# Patient Record
Sex: Male | Born: 1995 | Hispanic: Yes | Marital: Single | Smoking: Current some day smoker
Health system: Southern US, Community
[De-identification: ages and names within clinical notes are randomized; demographics above are authoritative.]

---

## 2020-08-06 ENCOUNTER — Encounter: Payer: Self-pay | Admitting: Emergency Medicine

## 2020-08-06 ENCOUNTER — Other Ambulatory Visit: Payer: Self-pay

## 2020-08-06 ENCOUNTER — Emergency Department
Admission: EM | Admit: 2020-08-06 | Discharge: 2020-08-06 | Disposition: A | Discharge status: Home / Self Care | Payer: Worker's Compensation | Attending: Emergency Medicine | Admitting: Emergency Medicine

## 2020-08-06 ENCOUNTER — Emergency Department: Payer: Worker's Compensation

## 2020-08-06 DIAGNOSIS — S0990XA Unspecified injury of head, initial encounter: Secondary | ICD-10-CM | POA: Diagnosis present

## 2020-08-06 DIAGNOSIS — S0003XA Contusion of scalp, initial encounter: Secondary | ICD-10-CM | POA: Insufficient documentation

## 2020-08-06 DIAGNOSIS — W228XXA Striking against or struck by other objects, initial encounter: Secondary | ICD-10-CM | POA: Diagnosis not present

## 2020-08-06 DIAGNOSIS — R040 Epistaxis: Secondary | ICD-10-CM | POA: Insufficient documentation

## 2020-08-06 DIAGNOSIS — F17200 Nicotine dependence, unspecified, uncomplicated: Secondary | ICD-10-CM | POA: Insufficient documentation

## 2020-08-06 DIAGNOSIS — Y990 Civilian activity done for income or pay: Secondary | ICD-10-CM | POA: Diagnosis not present

## 2020-08-06 NOTE — ED Triage Notes (Signed)
Pt to ED from work c/o head injury.  Filing WC from [employer].  States was bending over and when he came up hit his head on some equipment, had bleeding at time of accident from top of head and nose but bleeding controlled at this time.  Denies LOC.  Pt A&Ox4, ambulatory steady gait, in NAD at this time.

## 2020-08-06 NOTE — ED Notes (Signed)
Pt came in to be seen from [employer] with Medical treatment authorization form.  The form states we are to do a Workers Compensation initial visit and no lab testing is marked on the form that needs to be done.  I asked the patient if he had a supervisors number to call to verify, he stated no.  I called [employer] at [phone] and Jodi Marble at [phone] ext [phone] and left a message regarding any lab testing needed.  The message also included that the patients identity needed to be verified because the patient did not have any ID, photo or otherwise.  Awaiting a return call from either supervisor.  Forms placed in the box to go to medical records to be scanned into chart.

## 2020-08-06 NOTE — ED Notes (Signed)
Spoke with supervisor Lafonda Mosses)  States she would send someone here to verify this pt   Pt currently does not have ID on him.  States his ID is in his truck at work  Was brought to ED by security from [employer]

## 2020-08-06 NOTE — Discharge Instructions (Signed)
Follow-up with your company's doctor if any continued problems.  The CT scan did not show a brain injury.  You may take Tylenol as needed for pain.  You may also apply ice packs to your scalp as needed for discomfort or if any swelling.  Do not blow your nose as it may cause more nose bleeding

## 2020-08-06 NOTE — ED Provider Notes (Addendum)
[facility] Emergency Department Provider Note   ____________________________________________   None    (approximate)  I have reviewed the triage vital signs and the nursing notes.   HISTORY  Chief Complaint Head Injury Spanish interpreter  HPI Nicholas Mccormick is a 25 y.o. male presents to the ED for a reported Workmen's Comp. injury.  Patient states that he hit his head on a piece of equipment at approximately 11:30 PM without loss of consciousness.  Patient states that at approximately 1 AM he had a nosebleed and then again at 5:45 AM.  Patient had some mild visual disturbance that lasted "seconds".  He denies any nausea, vomiting, dizziness or difficulty with vision at this time.  He denies any facial injury or direct contact to his nose.  Currently rates pain as 5/10.     History reviewed. No pertinent past medical history.  There are no problems to display for this patient.   History reviewed. No pertinent surgical history.  Prior to Admission medications   Not on File    Allergies Patient has no known allergies.  History reviewed. No pertinent family history.  Social History Social History   Tobacco Use  . Smoking status: Current Some Day Smoker  . Smokeless tobacco: Never Used  Substance Use Topics  . Drug use: Never    Review of Systems Constitutional: No fever/chills Eyes: No visual changes. ENT: No sore throat.  Positive for nosebleed. Cardiovascular: Denies chest pain. Respiratory: Denies shortness of breath. Gastrointestinal: No abdominal pain.  No nausea, no vomiting.  Musculoskeletal: Negative for back pain. Skin: Negative for rash. Neurological: Negative for headaches, focal weakness or numbness. ____________________________________________   PHYSICAL EXAM:  VITAL SIGNS: ED Triage Vitals [08/06/20 0644]  Enc Vitals Group     BP (!) 141/74     Pulse Rate 72     Resp 14     Temp 98.2 F (36.8 C)     Temp  Source Oral     SpO2 98 %     Weight 176 lb (79.8 kg)     Height 5\' 6"  (1.676 m)     Head Circumference      Peak Flow      Pain Score 5     Pain Loc      Pain Edu?      Excl. in GC?     Constitutional: Alert and oriented. Well appearing and in no acute distress.  Patient is talkative, cooperative and ambulatory without any assistance. Eyes: Conjunctivae are normal. PERRL. EOMI. Head: Atraumatic.  No abrasions are noted to the scalp. Nose: No congestion/rhinnorhea.  No active bleeding on inspection of the nares.  No trauma to the nose is noted.  No soft tissue edema or discoloration. Mouth/Throat: Mucous membranes are moist.  Oropharynx non-erythematous. Neck: No stridor.   Cardiovascular: Normal rate, regular rhythm. Grossly normal heart sounds.  Good peripheral circulation. Respiratory: Normal respiratory effort.  No retractions. Lungs CTAB. Musculoskeletal: Moves upper and lower extremities any difficulty and patient is able to ambulate without any assistance. Neurologic:  Normal speech and language. No gross focal neurologic deficits are appreciated. No gait instability. Skin:  Skin is warm, dry and intact.  No abrasion or ecchymosis noted of the scalp. Psychiatric: Mood and affect are normal. Speech and behavior are normal.  ____________________________________________   LABS (all labs ordered are listed, but only abnormal results are displayed)  Labs Reviewed - No data to display ____________________________________________  RADIOLOGY  Levada Schilling, personally viewed and evaluated these images (plain radiographs) as part of my medical decision making, as well as reviewing the written report by the radiologist.   Official radiology report(s): CT Head Wo Contrast  Result Date: 08/06/2020 CLINICAL DATA:  Patient was hit in the head by window pain yesterday, right parietal/occipital area. Headache since. EXAM: CT HEAD WITHOUT CONTRAST TECHNIQUE: Contiguous axial images  were obtained from the base of the skull through the vertex without intravenous contrast. COMPARISON:  None. FINDINGS: Brain: No evidence of acute infarction, hemorrhage, hydrocephalus, extra-axial collection or mass lesion/mass effect. Vascular: No hyperdense vessel or unexpected calcification. Skull: Normal. Negative for fracture or focal lesion. Sinuses/Orbits: No acute finding. Other: None. IMPRESSION: No acute intracranial process. Electronically Signed   By: Emmaline Kluver M.D.   On: 08/06/2020 08:55    ____________________________________________   PROCEDURES  Procedure(s) performed (including Critical Care):  Procedures   ____________________________________________   INITIAL IMPRESSION / ASSESSMENT AND PLAN / ED COURSE  As part of my medical decision making, I reviewed the following data within the electronic MEDICAL RECORD NUMBER Notes from prior ED visits and [place] Controlled Substance Database  25 year old male presents to the ED with a reported Workmen's Comp. injury at [employer] that occurred at approximate 11:30 PM.  Patient states that he hit his head on a piece of equipment without LOC but had a nosebleed both at 1 AM and 5:40 AM.  Patient has continued to ambulate without any difficulty and denies any dizziness or visual disturbance.  Physical exam was benign there was no active nosebleed at the time of exam and no abrasions were seen to the scalp.  CT head was negative and patient was made aware.  Patient is cleared to return to work today.  It is unlikely that his nosebleed was a direct result of hitting his head with patient's history.  ____________________________________________   FINAL CLINICAL IMPRESSION(S) / ED DIAGNOSES  Final diagnoses:  Contusion of scalp, initial encounter  Epistaxis     ED Discharge Orders    None      *Please note:  Nicholas Mccormick was evaluated in Emergency Department on 08/06/2020 for the symptoms described in the history of  present illness. He was evaluated in the context of the global COVID-19 pandemic, which necessitated consideration that the patient might be at risk for infection with the SARS-CoV-2 virus that causes COVID-19. Institutional protocols and algorithms that pertain to the evaluation of patients at risk for COVID-19 are in a state of rapid change based on information released by regulatory bodies including the CDC and federal and state organizations. These policies and algorithms were followed during the patient's care in the ED.  Some ED evaluations and interventions may be delayed as a result of limited staffing during and the pandemic.*   Note:  This document was prepared using Dragon voice recognition software and may include unintentional dictation errors.    Tommi Rumps, PA-C 08/06/20 1052    Tommi Rumps, PA-C 08/06/20 1052    Sharman Cheek, MD 08/06/20 [phone]

## 2020-08-06 NOTE — ED Notes (Signed)
Supervisor here to identify patient.  Robyn Haber, [phone], identified as Dentist, and ID'd patient.  UDS done and sent to lab.

## 2021-03-07 IMAGING — CT CT HEAD W/O CM
3 series · 16 of 46 positions shown, 19 images · non-contrast
Comparison: None.

CLINICAL DATA: Patient was hit in the head by window pain
yesterday, right parietal/occipital area. Headache since.

EXAM:
CT HEAD WITHOUT CONTRAST
TECHNIQUE: Contiguous axial images were obtained from the base of the skull
through the vertex without intravenous contrast.

[Series 2: head wo · axial · 0.42mm/px · z∈[-113,+7]mm · 10 of 29 slices shown, 13 images]
[im 3/29  brain]
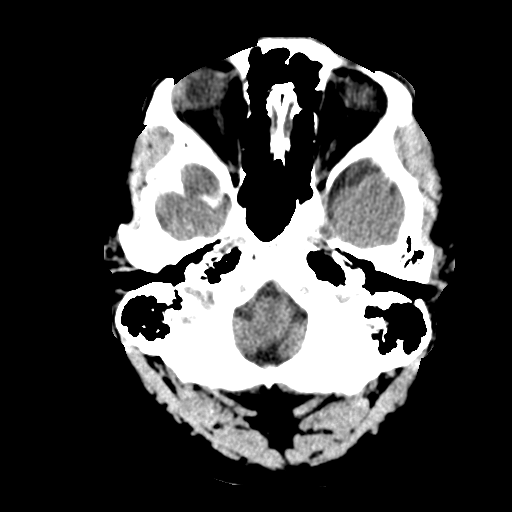
[im 3/29  bone]
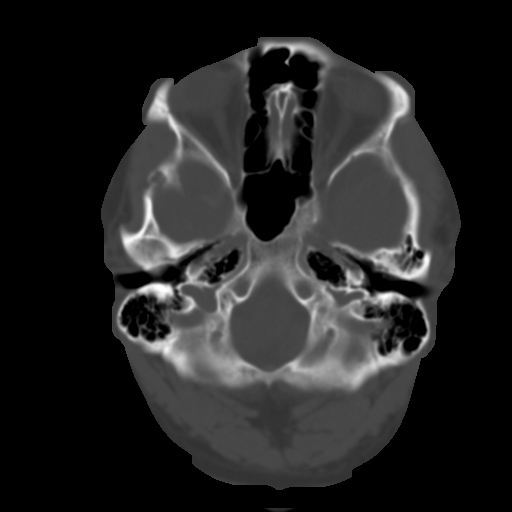
[im 6/29  brain]
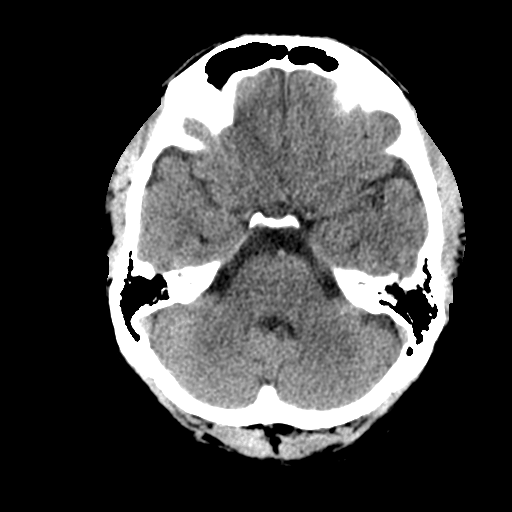
[im 8/29  brain]
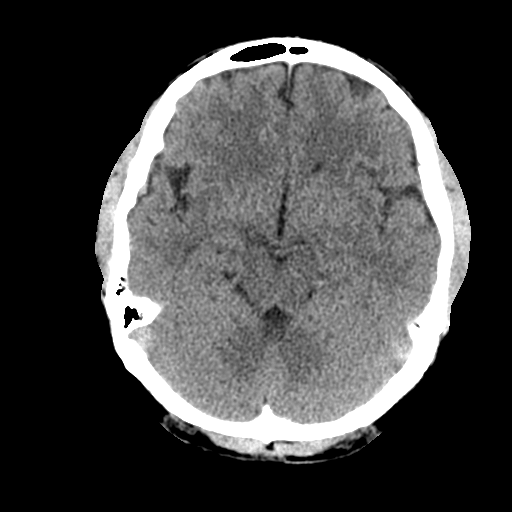
[im 11/29  brain]
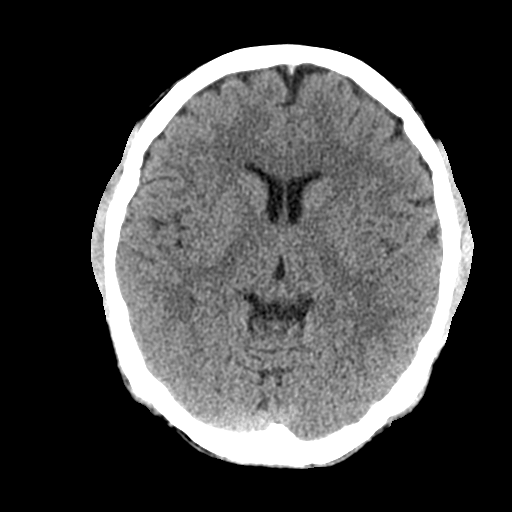
[im 14/29  brain]
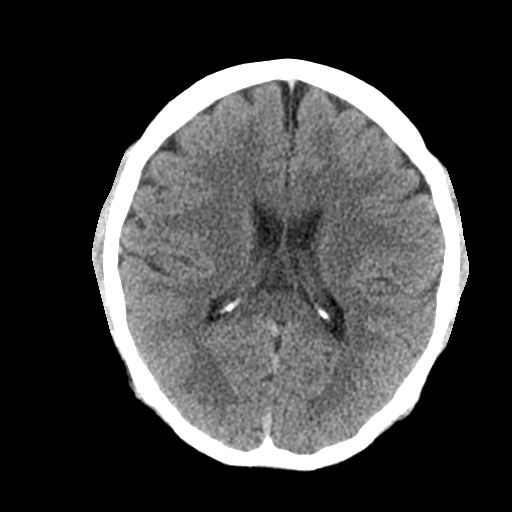
[im 14/29  bone]
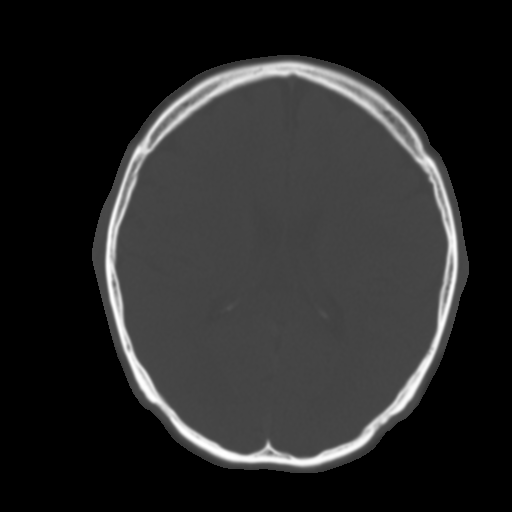
[im 16/29  brain]
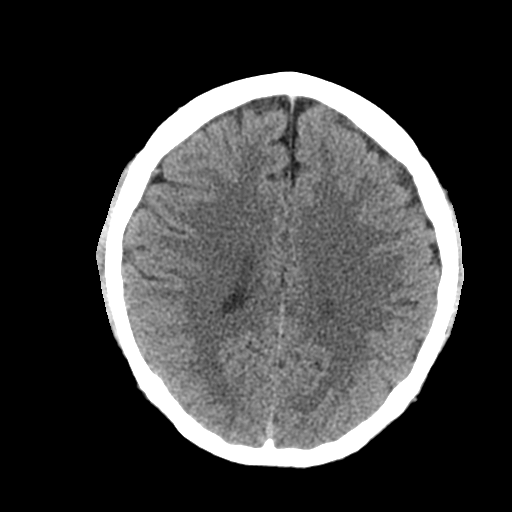
[im 19/29  brain]
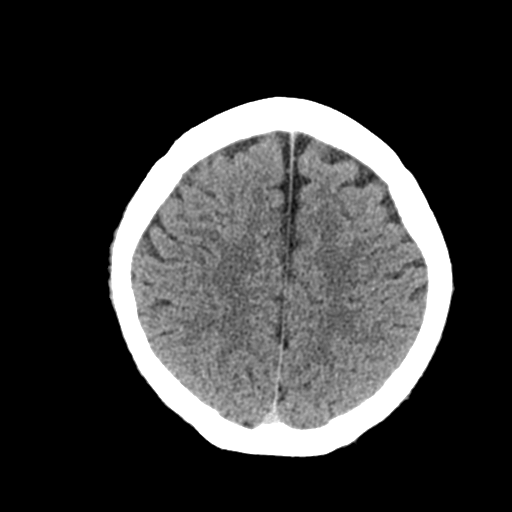
[im 22/29  brain]
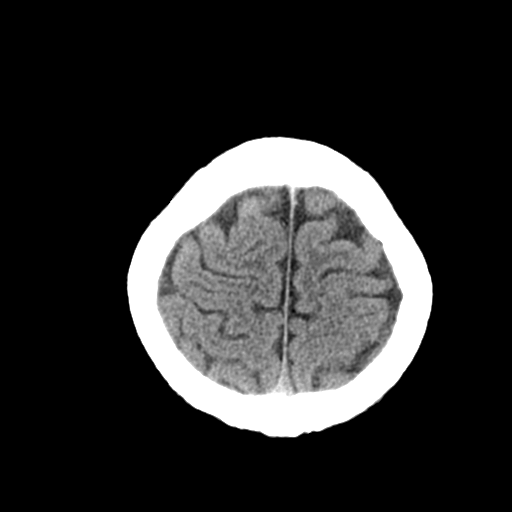
[im 24/29  brain]
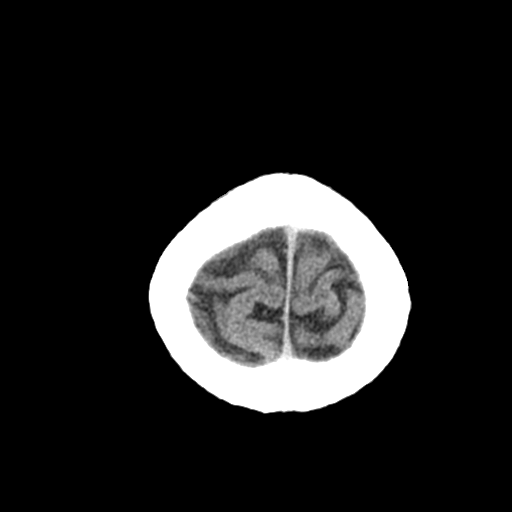
[im 24/29  bone]
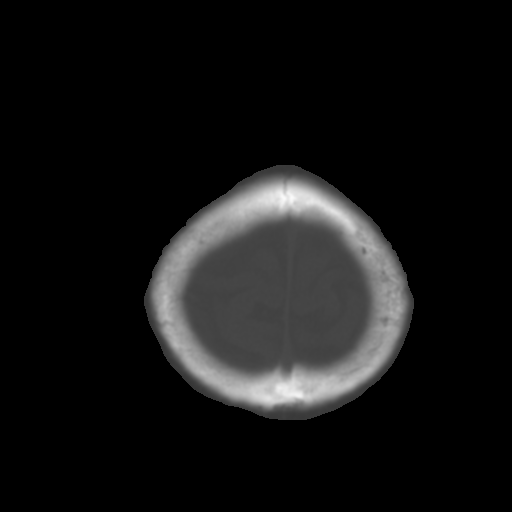
[im 27/29  brain]
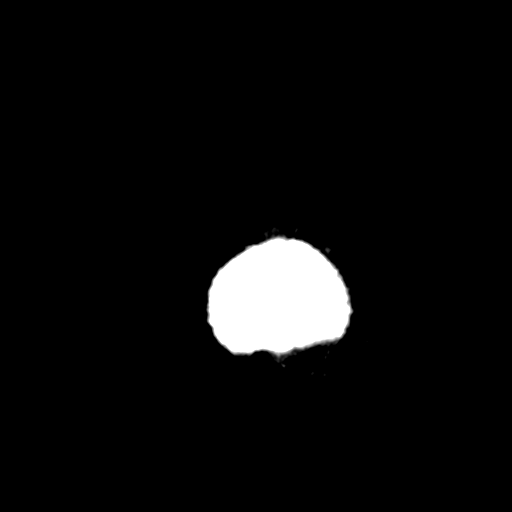

[Series 4: coronal soft tissue · coronal · 0.31mm/px · 3 of 65 slices shown]
[im 22/65  brain]
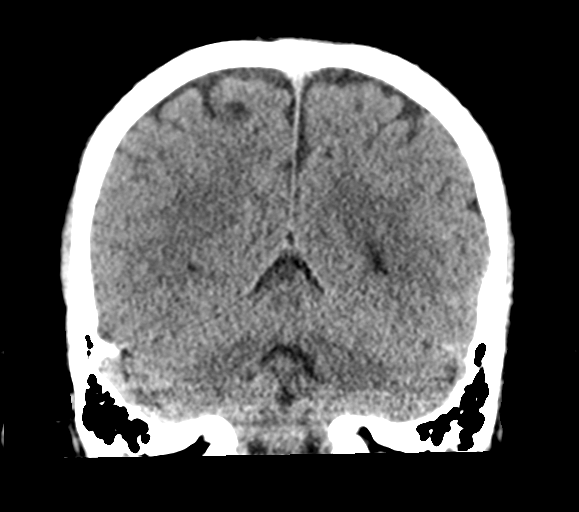
[im 29/65  brain]
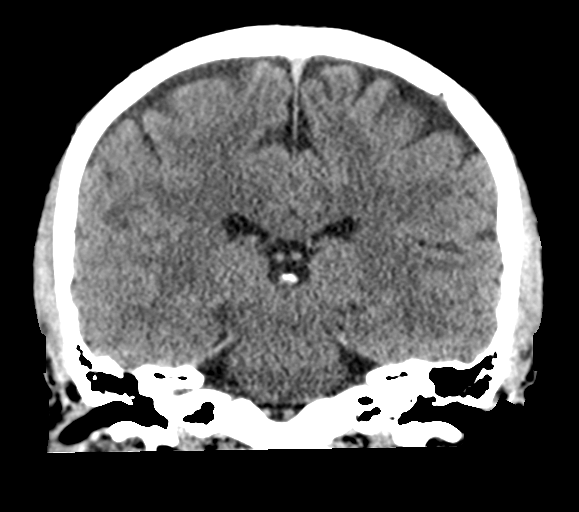
[im 36/65  brain]
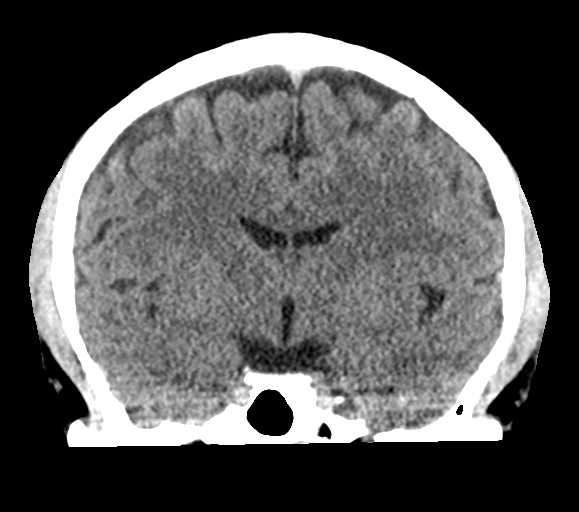

[Series 5: sagittal soft tissue · sagittal · 0.31mm/px · 3 of 57 slices shown]
[im 19/57  brain]
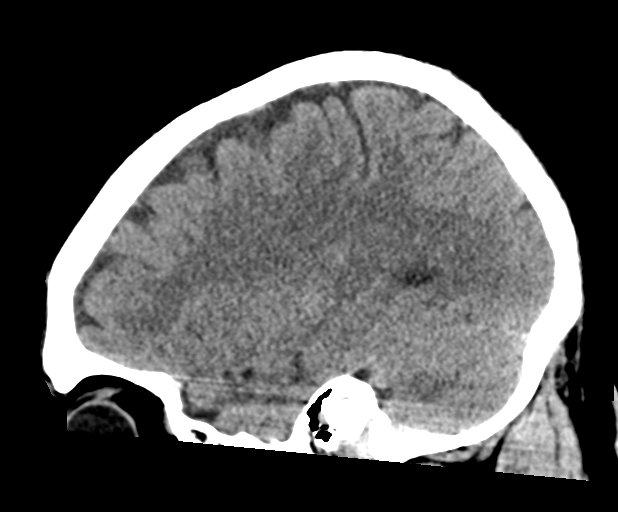
[im 29/57  brain]
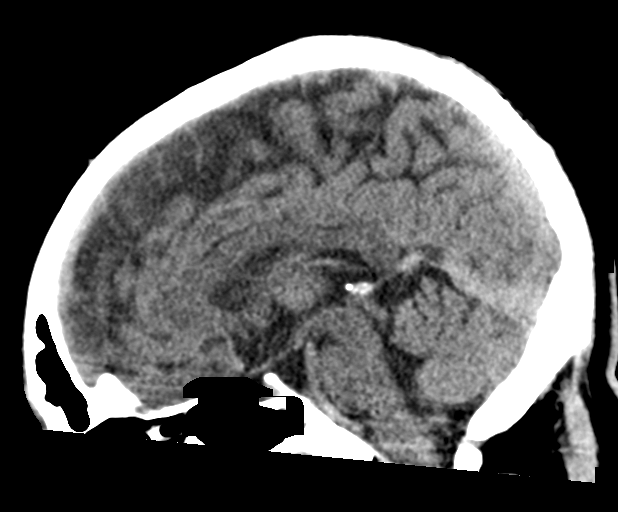
[im 38/57  brain]
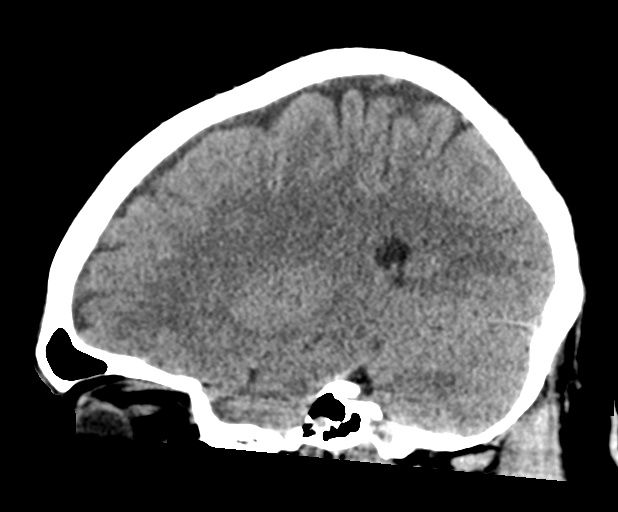

[16 of 46 positions shown; findings below may reference images not displayed]

FINDINGS: Brain: No evidence of acute infarction, hemorrhage, hydrocephalus,
extra-axial collection or mass lesion/mass effect.

Vascular: No hyperdense vessel or unexpected calcification.

Skull: Normal. Negative for fracture or focal lesion.

Sinuses/Orbits: No acute finding.

Other: None.
IMPRESSION: No acute intracranial process.

## 2021-08-03 ENCOUNTER — Encounter: Payer: Self-pay | Admitting: Emergency Medicine

## 2021-08-03 ENCOUNTER — Other Ambulatory Visit: Payer: Self-pay

## 2021-08-03 ENCOUNTER — Emergency Department
Admission: EM | Admit: 2021-08-03 | Discharge: 2021-08-03 | Disposition: A | Discharge status: Home / Self Care | Payer: Worker's Compensation | Attending: Emergency Medicine | Admitting: Emergency Medicine

## 2021-08-03 ENCOUNTER — Emergency Department: Payer: Worker's Compensation

## 2021-08-03 DIAGNOSIS — T2112XA Burn of first degree of abdominal wall, initial encounter: Secondary | ICD-10-CM

## 2021-08-03 DIAGNOSIS — Y990 Civilian activity done for income or pay: Secondary | ICD-10-CM | POA: Diagnosis not present

## 2021-08-03 DIAGNOSIS — R109 Unspecified abdominal pain: Secondary | ICD-10-CM | POA: Diagnosis present

## 2021-08-03 DIAGNOSIS — X102XXA Contact with fats and cooking oils, initial encounter: Secondary | ICD-10-CM | POA: Diagnosis not present

## 2021-08-03 DIAGNOSIS — I10 Essential (primary) hypertension: Secondary | ICD-10-CM

## 2021-08-03 MED ORDER — IBUPROFEN 400 MG PO TABS
400.0000 mg | ORAL_TABLET | Freq: Once | ORAL | Status: AC
  Administered 2021-08-03: 400 mg via ORAL
  Filled 2021-08-03: qty 1

## 2021-08-03 NOTE — ED Triage Notes (Signed)
Pt to ED from work, [employer], states cream had too pressure pressure and exploded causing piece of aluminum to hit patient in the upper abd.  Redness noted to stomach.  Pt A&Ox4, chest rise even and unlabored, in NAD at this time.

## 2021-08-03 NOTE — ED Notes (Signed)
Attempted to obtain WC UDS.  Pt does not meet Identification criteria,  Pt presented a [employer] ID that expired in 2008.  Attempts to call contact info from [employer] without success,

## 2021-08-03 NOTE — ED Provider Notes (Signed)
[facility] Provider Note    Event Date/Time   First MD Initiated Contact with Patient 08/03/21 [phone]     (approximate)   History   Abdominal Pain   HPI  Nicholas Mccormick is a 26 y.o. male with no significant past medical history who presents for evaluation of a burn to his abdomen.  Patient states he was at work when a container containing some hot cream fell and exploded causing a piece of hot aluminum to strike his abdomen.  He denies any other injuries and states he is otherwise in his usual state of health.  No analgesia prior to arrival.  He denies any other acute concerns.      Physical Exam  Triage Vital Signs: ED Triage Vitals  Enc Vitals Group     BP 08/03/21 0229 (!) 148/99     Pulse Rate 08/03/21 0229 85     Resp 08/03/21 0229 16     Temp 08/03/21 0229 98.5 F (36.9 C)     Temp Source 08/03/21 0229 Oral     SpO2 08/03/21 0229 96 %     Weight 08/03/21 0230 175 lb (79.4 kg)     Height 08/03/21 0230 5' 6.14" (1.68 m)     Head Circumference --      Peak Flow --      Pain Score 08/03/21 0230 4     Pain Loc --      Pain Edu? --      Excl. in GC? --     Most recent vital signs: Vitals:   08/03/21 0229 08/03/21 0607  BP: (!) 148/99 133/86  Pulse: 85 79  Resp: 16 17  Temp: 98.5 F (36.9 C) 98.1 F (36.7 C)  SpO2: 96% 98%    General: Awake, no distress.  CV:  Good peripheral perfusion.  Resp:  Normal effort.  Clear bilaterally. Abd:  No distention.  Soft throughout.  There is an oval-shaped area of erythema and mild tenderness only 2 x 1 cm in diameter just to the right of the umbilicus.  There is no blistering or induration streaking or other surrounding skin changes or evidence of trauma. Other:     ED Results / Procedures / Treatments  Labs (all labs ordered are listed, but only abnormal results are displayed) Labs Reviewed - No data to display   EKG    RADIOLOGY    PROCEDURES:  Critical Care performed:  No  Procedures    MEDICATIONS ORDERED IN ED: Medications  ibuprofen (ADVIL) tablet 400 mg (400 mg Oral Given 08/03/21 0607)     IMPRESSION / MDM / ASSESSMENT AND PLAN / ED COURSE  I reviewed the triage vital signs and the nursing notes.                               Patient presents for evaluation of small area of pain and redness in his abdomen just right of the umbilicus where he was hit briefly by palpation of aluminum while at work.  No other injuries or burns.  On exam he has a superficial burn without any blistering no evidence of a deeper injury such as a visceral injury or subcu injury.  No indication for burn referral.  No indication for debridement.  Analgesia is well controlled and patient only amenable to excepting some ibuprofen after significant encouragement from this provider as he seems to be a little sore.  Discussed keeping the wound clean and tactile ibuprofen as needed for soreness and returning for any new or worsening of symptoms.  He is denying any other acute concerns at this time.  Discharged in stable condition.  Advised morning to have his blood pressure rechecked by PCP.      FINAL CLINICAL IMPRESSION(S) / ED DIAGNOSES   Final diagnoses:  Hypertension, unspecified type  Superficial burn of abdominal wall, initial encounter     Rx / DC Orders   ED Discharge Orders     None        Note:  This document was prepared using Dragon voice recognition software and may include unintentional dictation errors.   Gilles Chiquito, MD 08/03/21 [phone]

## 2022-03-04 IMAGING — CR DG CHEST 2V
1 series · 2 of 2 positions shown · non-contrast
Comparison: None.

CLINICAL DATA: Blunt trauma to the chest, initial encounter

EXAM:
CHEST - 2 VIEW

[Series 1: dg chest 2 view · 0.14mm/px · 2 of 2 slices shown]
[im 1/2]
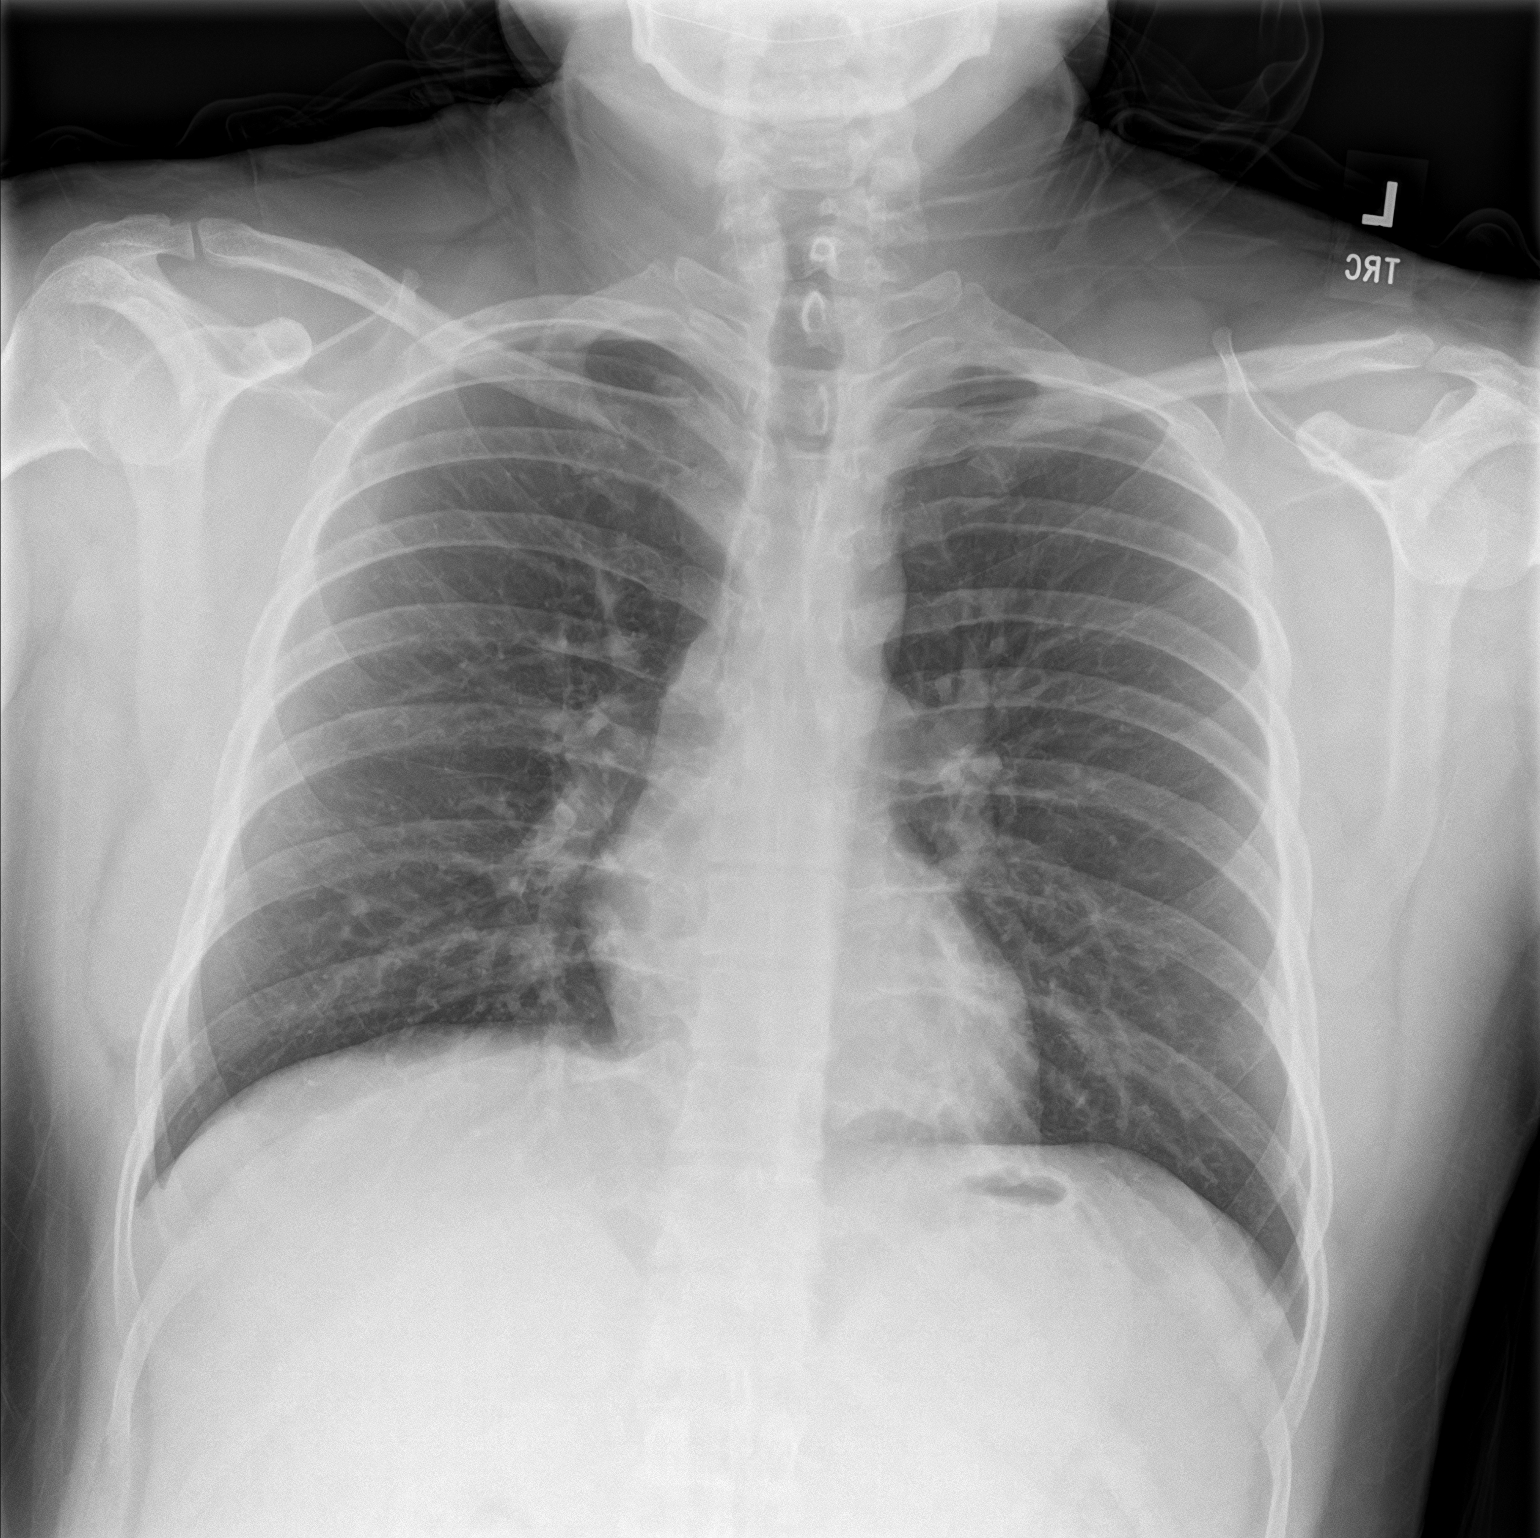
[im 2/2]
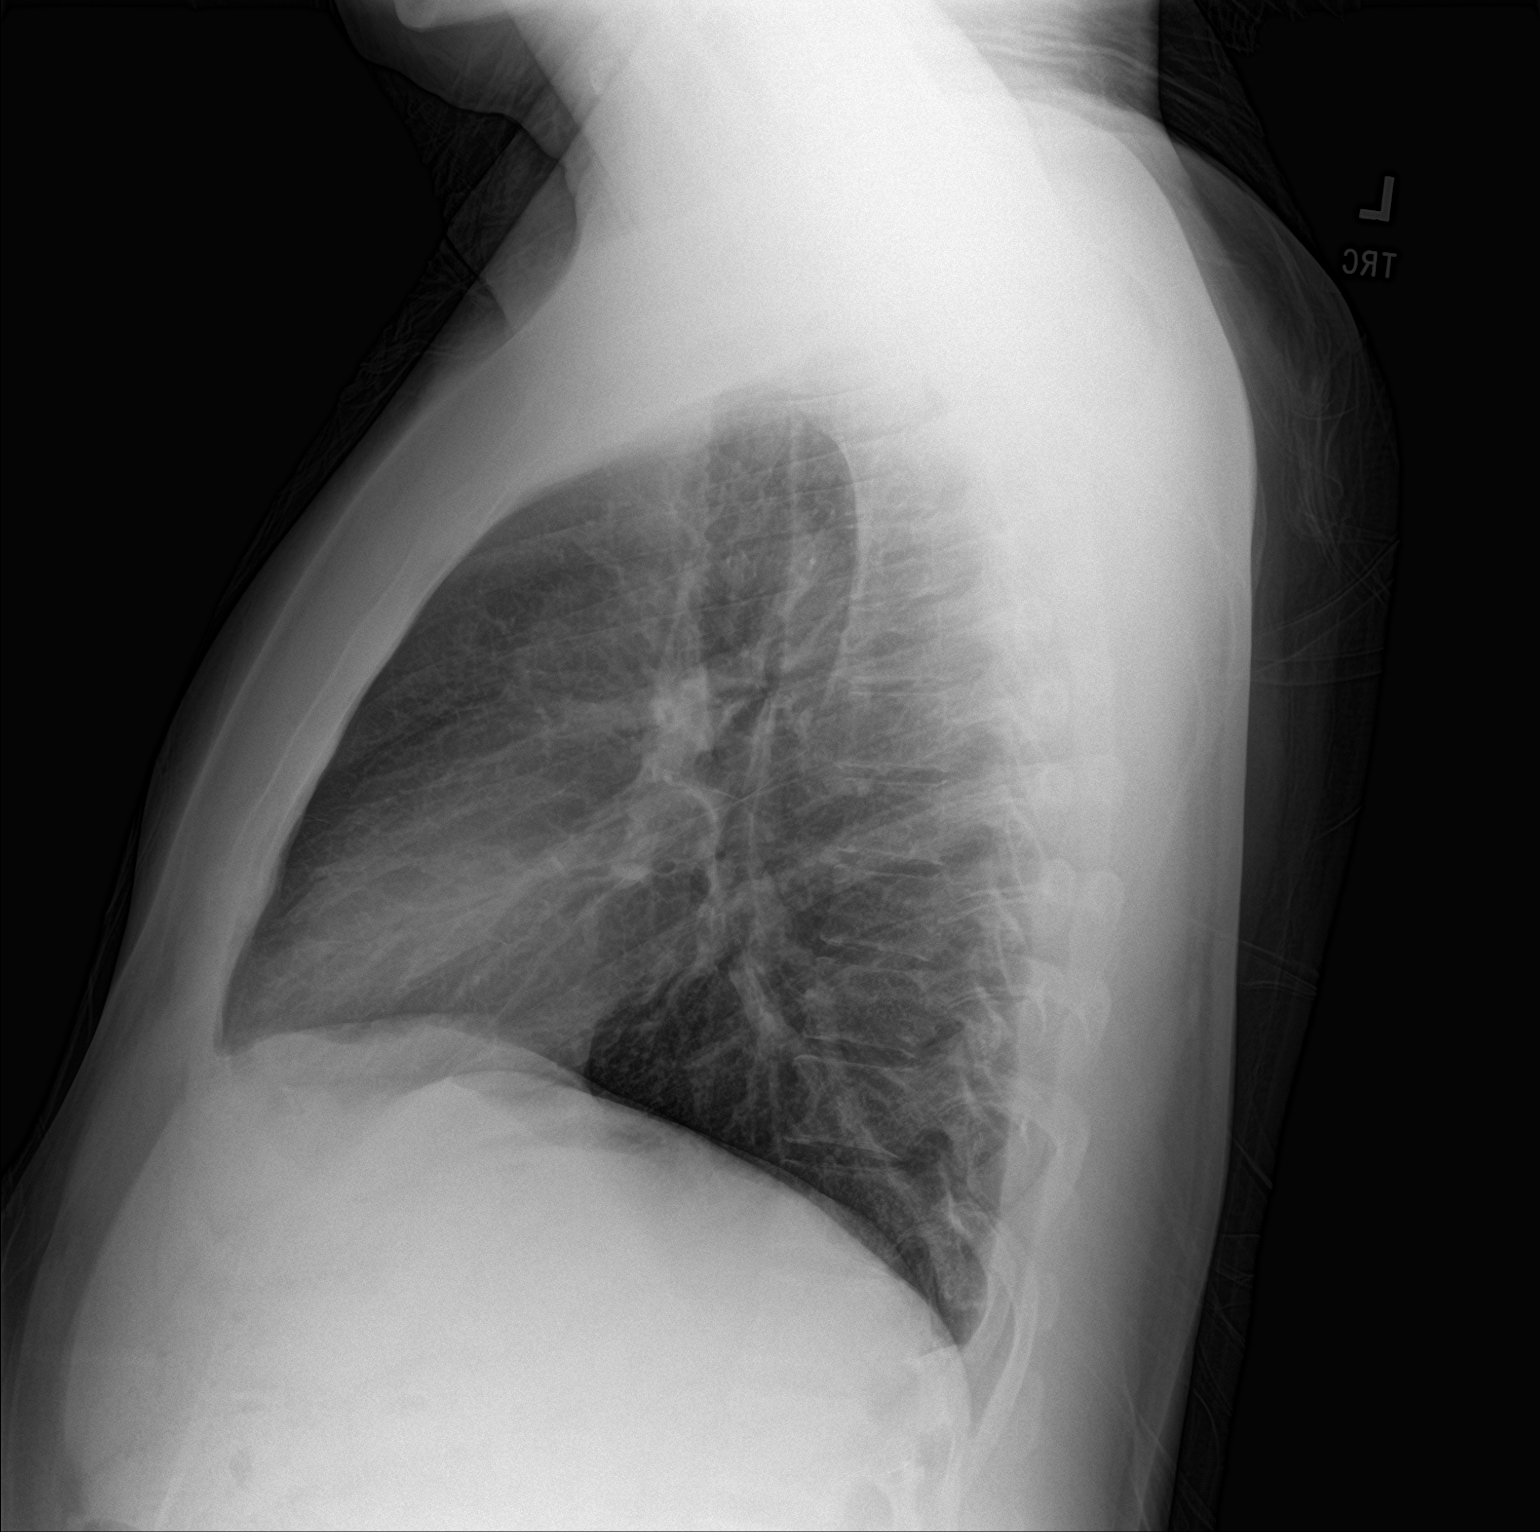

[2 of 2 positions shown; findings below may reference images not displayed]

FINDINGS: The heart size and mediastinal contours are within normal limits.
Both lungs are clear. The visualized skeletal structures are
unremarkable.
IMPRESSION: No active cardiopulmonary disease.
